# Patient Record
Sex: Female | Born: 1960 | Race: White | Hispanic: No | Marital: Single | Smoking: Current some day smoker
Health system: Southern US, Community
[De-identification: ages and names within clinical notes are randomized; demographics above are authoritative.]

## PROBLEM LIST (undated history)

## (undated) DIAGNOSIS — M722 Plantar fascial fibromatosis: Secondary | ICD-10-CM

## (undated) HISTORY — PX: OTHER SURGICAL HISTORY: SHX169

## (undated) HISTORY — PX: ABDOMINAL HYSTERECTOMY: SHX81

---

## 2014-11-10 ENCOUNTER — Encounter (HOSPITAL_BASED_OUTPATIENT_CLINIC_OR_DEPARTMENT_OTHER): Payer: Self-pay | Admitting: *Deleted

## 2014-11-10 ENCOUNTER — Emergency Department (HOSPITAL_BASED_OUTPATIENT_CLINIC_OR_DEPARTMENT_OTHER)
Admission: EM | Admit: 2014-11-10 | Discharge: 2014-11-10 | Disposition: A | Discharge status: Home / Self Care | Payer: Worker's Compensation | Attending: Emergency Medicine | Admitting: Emergency Medicine

## 2014-11-10 ENCOUNTER — Emergency Department (HOSPITAL_BASED_OUTPATIENT_CLINIC_OR_DEPARTMENT_OTHER): Payer: Worker's Compensation

## 2014-11-10 DIAGNOSIS — W01198A Fall on same level from slipping, tripping and stumbling with subsequent striking against other object, initial encounter: Secondary | ICD-10-CM | POA: Insufficient documentation

## 2014-11-10 DIAGNOSIS — W19XXXA Unspecified fall, initial encounter: Secondary | ICD-10-CM

## 2014-11-10 DIAGNOSIS — Y9389 Activity, other specified: Secondary | ICD-10-CM | POA: Insufficient documentation

## 2014-11-10 DIAGNOSIS — S59901A Unspecified injury of right elbow, initial encounter: Secondary | ICD-10-CM | POA: Diagnosis present

## 2014-11-10 DIAGNOSIS — Z720 Tobacco use: Secondary | ICD-10-CM | POA: Insufficient documentation

## 2014-11-10 DIAGNOSIS — S300XXA Contusion of lower back and pelvis, initial encounter: Secondary | ICD-10-CM | POA: Diagnosis not present

## 2014-11-10 DIAGNOSIS — Y9289 Other specified places as the place of occurrence of the external cause: Secondary | ICD-10-CM | POA: Diagnosis not present

## 2014-11-10 DIAGNOSIS — S40811A Abrasion of right upper arm, initial encounter: Secondary | ICD-10-CM | POA: Insufficient documentation

## 2014-11-10 DIAGNOSIS — Y998 Other external cause status: Secondary | ICD-10-CM | POA: Insufficient documentation

## 2014-11-10 DIAGNOSIS — S5001XA Contusion of right elbow, initial encounter: Secondary | ICD-10-CM | POA: Diagnosis not present

## 2014-11-10 HISTORY — DX: Plantar fascial fibromatosis: M72.2

## 2014-11-10 MED ORDER — HYDROCODONE-ACETAMINOPHEN 5-325 MG PO TABS: 1.0000 | ORAL_TABLET | Freq: Four times a day (QID) | ORAL | Status: AC | PRN

## 2014-11-10 MED ORDER — IBUPROFEN 800 MG PO TABS
800.0000 mg | ORAL_TABLET | Freq: Once | ORAL | Status: AC
  Administered 2014-11-10: 800 mg via ORAL
  Filled 2014-11-10: qty 1

## 2014-11-10 NOTE — Discharge Instructions (Signed)
You can take ibuprofen or aleve, available over the counter, for pain during the day and while working.  Only take the prescribed pain medication (norco) when you are not working or at night for sleep.    Tailbone Injury The tailbone (coccyx) is the small bone at the lower end of the spine. A tailbone injury may involve stretched ligaments, bruising, or a broken bone (fracture). Women are more vulnerable to this injury due to having a wider pelvis. CAUSES  This type of injury typically occurs from falling and landing on the tailbone. Repeated strain or friction from actions such as rowing and bicycling may also injure the area. The tailbone can be injured during childbirth. Infections or tumors may also press on the tailbone and cause pain. Sometimes, the cause of injury is unknown. SYMPTOMS   Bruising.  Pain when sitting.  Painful bowel movements.  In women, pain during intercourse. DIAGNOSIS  Your caregiver can diagnose a tailbone injury based on your symptoms and a physical exam. X-rays may be taken if a fracture is suspected. Your caregiver may also use an MRI scan imaging test to evaluate your symptoms. TREATMENT  Your caregiver may prescribe medicines to help relieve your pain. Most tailbone injuries heal on their own in 4 to 6 weeks. However, if the injury is caused by an infection or tumor, the recovery period may vary. PREVENTION  Wear appropriate padding and sports gear when bicycling and rowing. This can help prevent an injury from repeated strain or friction. HOME CARE INSTRUCTIONS   Put ice on the injured area.  Put ice in a plastic bag.  Place a towel between your skin and the bag.  Leave the ice on for 15-20 minutes, every hour while awake for the first 1 to 2 days.  Sit on a large, rubber or inflated ring or cushion to ease your pain. Lean forward when sitting to help decrease discomfort.  Avoid sitting for long periods of time.  Increase your activity as the pain  allows.  Only take over-the-counter or prescription medicines for pain, discomfort, or fever as directed by your caregiver.  You may use stool softeners if it is painful to have a bowel movement, or as directed by your caregiver.  Eat a diet with plenty of fiber to help prevent constipation.  Keep all follow-up appointments as directed by your caregiver. SEEK MEDICAL CARE IF:   Your pain becomes worse.  Your bowel movements cause a great deal of discomfort.  You are unable to have a bowel movement.  You have a fever. MAKE SURE YOU:  Understand these instructions.  Will watch your condition.  Will get help right away if you are not doing well or get worse. Document Released: 02/29/2000 Document Revised: 05/26/2011 Document Reviewed: 09/26/2010 [facility] Patient Information 2015 [place], [place]. This information is not intended to replace advice given to you by your health care provider. Make sure you discuss any questions you have with your health care provider.

## 2014-11-10 NOTE — ED Notes (Signed)
Fell at work yesterday. Pain in her coccyx and right elbow.

## 2014-11-10 NOTE — ED Provider Notes (Signed)
CSN: [id number]     Arrival date & time 11/10/14  1726 History  This chart was scribed for Tilden Fossa, MD by Evon Slack, ED Scribe. This patient was seen in room MH04/MH04 and the patient's care was started at 6:18 PM.      Chief Complaint  Patient presents with  . Fall   The history is provided by the patient. No language interpreter was used.   HPI Comments: Virginia James is a 54 y.o. female who presents to the Emergency Department complaining of fall onset 1 day prior. Pt states she fell off of a shelf and landed on her bottom. Pt states that she fell from about 6-7 feet. Pt states that she did land on concrete tile. Pt is complaining of low back pain, tailbone pain, and right elbow pain. Pt states she has taken hydrocodone with slight relief. Pt states she has applied ice with slight relief as well. Pt states that the pain is worse when ambulating. Pt denies head injury or LOC. Reports Hx superficial blood clot. She states that she stopped taking the Eliquis 3 days ago.    Past Medical History  Diagnosis Date  . Plantar fasciitis    Past Surgical History  Procedure Laterality Date  . Abdominal hysterectomy    . Tubal ligaton     No family history on file. Social History  Substance Use Topics  . Smoking status: Current Some Day Smoker    Types: Cigarettes  . Smokeless tobacco: None  . Alcohol Use: No   OB History    No data available      Review of Systems  Musculoskeletal: Positive for back pain and arthralgias.  Skin: Negative for wound.  Neurological: Negative for syncope and headaches.  All other systems reviewed and are negative.    Allergies  Review of patient's allergies indicates no known allergies.  Home Medications   Prior to Admission medications   Not on File   BP 123/63 mmHg  Pulse 101  Temp(Src) 98.6 F (37 C) (Oral)  Resp 20  Ht  (1.6 m)  Wt 175 lb (79.379 kg)  BMI 31.01 kg/m2  SpO2 96%   Physical Exam  Constitutional:  She is oriented to person, place, and time. She appears well-developed and well-nourished. No distress.  HENT:  Head: Normocephalic and atraumatic.  Eyes: Conjunctivae and EOM are normal.  Neck: Neck supple. No tracheal deviation present.  Cardiovascular: Normal rate, regular rhythm and normal heart sounds.   Pulmonary/Chest: Effort normal and breath sounds normal. No respiratory distress. She has no wheezes. She has no rales.  Musculoskeletal: Normal range of motion.  small abrasion of right distal upper arm, full ROM of right elbow, 2+ radial pulses.  Tender over midline of lower lumber spine and coccyx and small amount of ecchymosis over sacrum.   Neurological: She is alert and oriented to person, place, and time.  Strength 5/5 in all 4 extremities.   Skin: Skin is warm and dry.  Psychiatric: She has a normal mood and affect. Her behavior is normal.  Nursing note and vitals reviewed.   ED Course  Procedures (including critical care time) DIAGNOSTIC STUDIES: Oxygen Saturation is 96% on RA, adequate by my interpretation.    COORDINATION OF CARE: 6:30 PM-Discussed treatment plan with pt at bedside and pt agreed to plan.    Labs Review Labs Reviewed - No data to display  Imaging Review Dg Lumbar Spine Complete  11/10/2014   CLINICAL DATA:  Status  post fall.  EXAM: LUMBAR SPINE - COMPLETE 4+ VIEW  COMPARISON:  None  FINDINGS: There is no evidence of lumbar spine fracture. Alignment is normal. Intervertebral disc spaces are maintained.  IMPRESSION: Negative.   Electronically Signed   By: Signa Kell M.D.   On: 11/10/2014 19:25   Dg Sacrum/coccyx  11/10/2014   CLINICAL DATA:  Larey Seat off a shelf and landed on her bottom 1 day ago, approximately 6-7 foot fall, low back and tailbone pain  EXAM: SACRUM AND COCCYX - 2+ VIEW  COMPARISON:  None  FINDINGS: Symmetric SI joints and sacral foramina.  Osseous mineralization normal.  Hip joints symmetric.  No definite fracture or bone destruction.   IMPRESSION: No acute osseous abnormalities.   Electronically Signed   By: Ulyses Southward M.D.   On: 11/10/2014 19:26   Dg Elbow Complete Right  11/10/2014   CLINICAL DATA:  Larey Seat off shelf and landed on buttocks 1 day ago. Fall from 6-7 feet. Low back pain and coccygeal pain. Right elbow pain.  EXAM: RIGHT ELBOW - COMPLETE 3+ VIEW  COMPARISON:  None.  FINDINGS: There is no evidence of fracture, dislocation, or joint effusion. There is no evidence of arthropathy or other focal bone abnormality. Soft tissues are unremarkable.  IMPRESSION: Negative.   Electronically Signed   By: Elberta Fortis M.D.   On: 11/10/2014 19:26   I have personally reviewed and evaluated these images and lab results as part of my medical decision-making.   EKG Interpretation None      MDM   Final diagnoses:  Fall, initial encounter  Elbow contusion, right, initial encounter  Coccyx contusion, initial encounter   Patient here for evaluation of tailbone and low back pain following a fall yesterday. Patient is neurologically intact examination. There is no evidence of acute fracture on plain film imaging. Discussed with patient on care for coccyx contusion with stool softeners activity as tolerated, NSAIDs, outpatient follow-up.   I personally performed the services described in this documentation, which was scribed in my presence. The recorded information has been reviewed and is accurate.      Tilden Fossa, MD 11/10/14 [phone]

## 2017-02-03 ENCOUNTER — Telehealth: Payer: Self-pay

## 2017-02-03 NOTE — Telephone Encounter (Signed)
error 

## 2017-03-28 IMAGING — DX DG SACRUM/COCCYX 2+V
3 series · 3 of 3 positions shown · non-contrast
Comparison: None

CLINICAL DATA: Fell off a shelf and landed on her bottom 1 day ago,
approximately 6-7 foot fall, low back and tailbone pain

EXAM:
SACRUM AND COCCYX - 2+ VIEW

[coccyx ap]
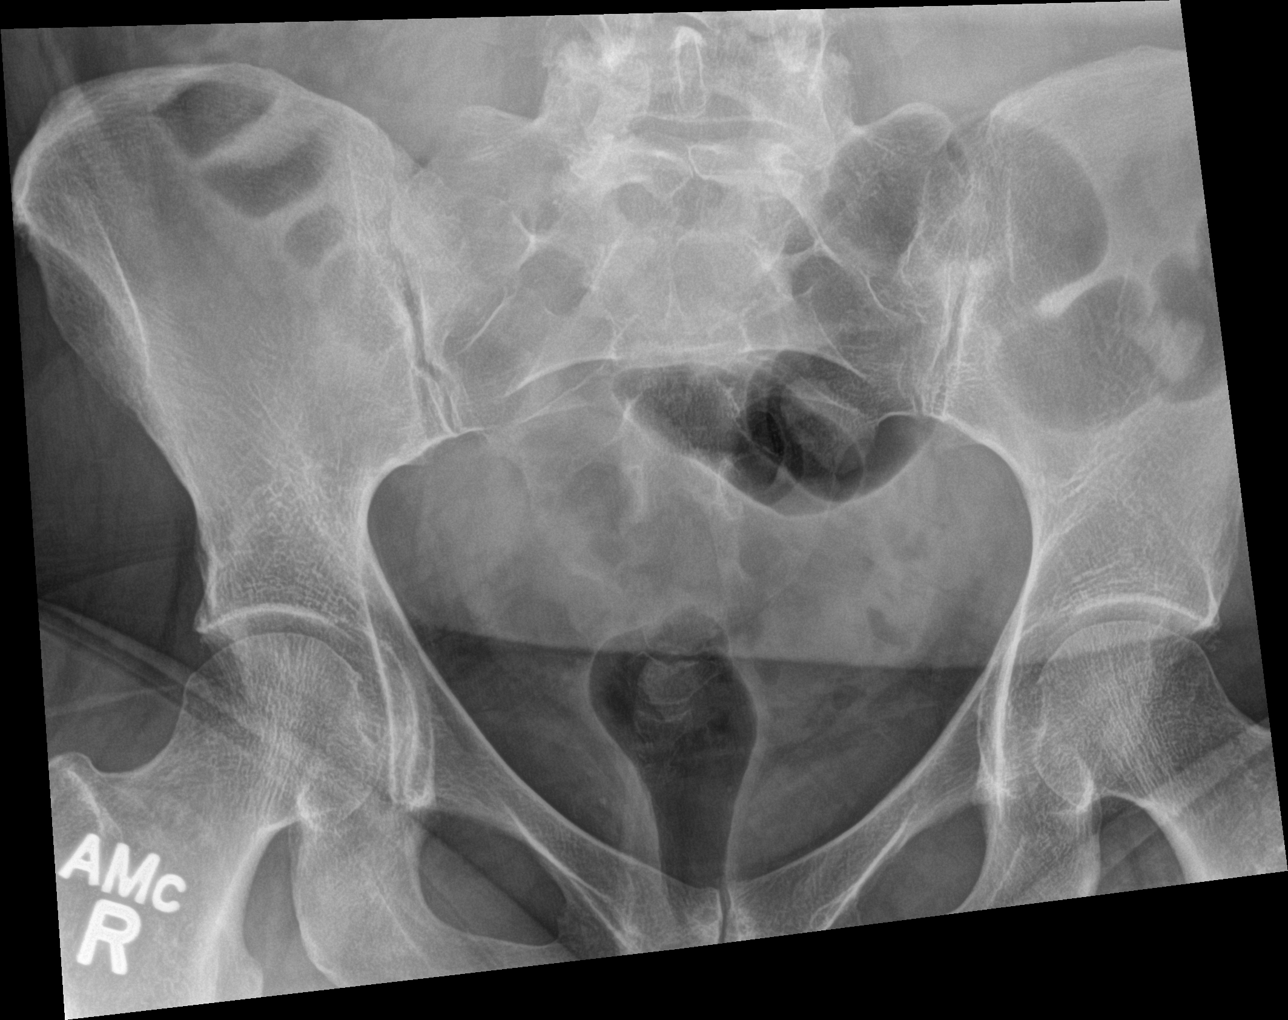

[sacrum ap]
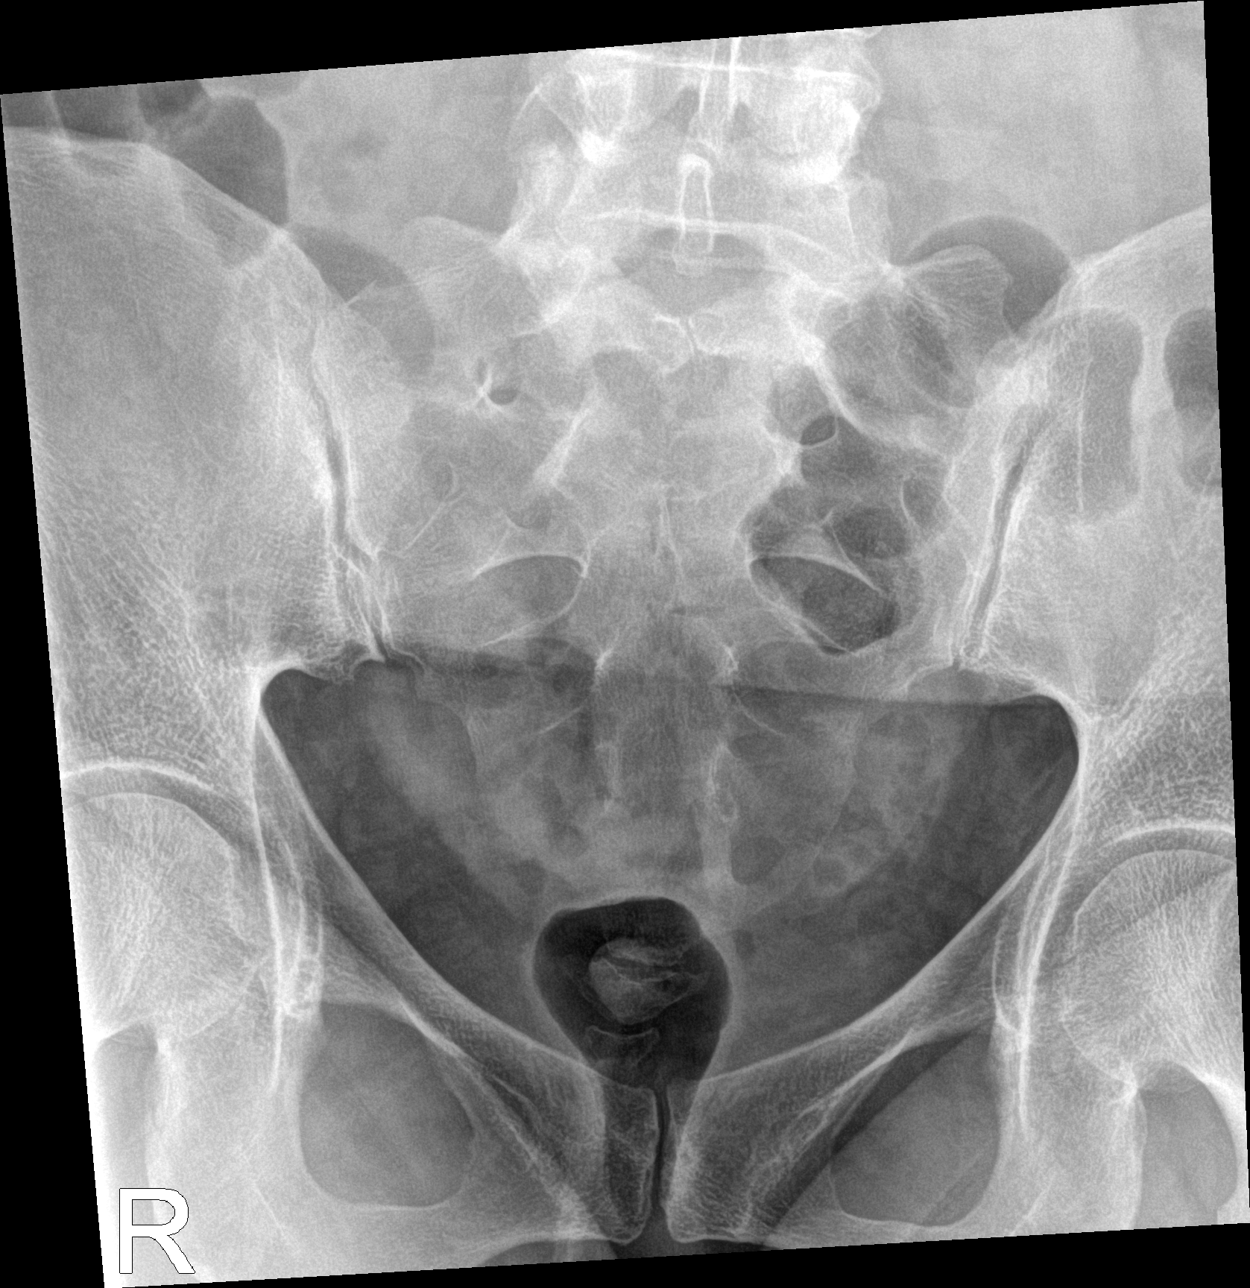

[sacrum lat]
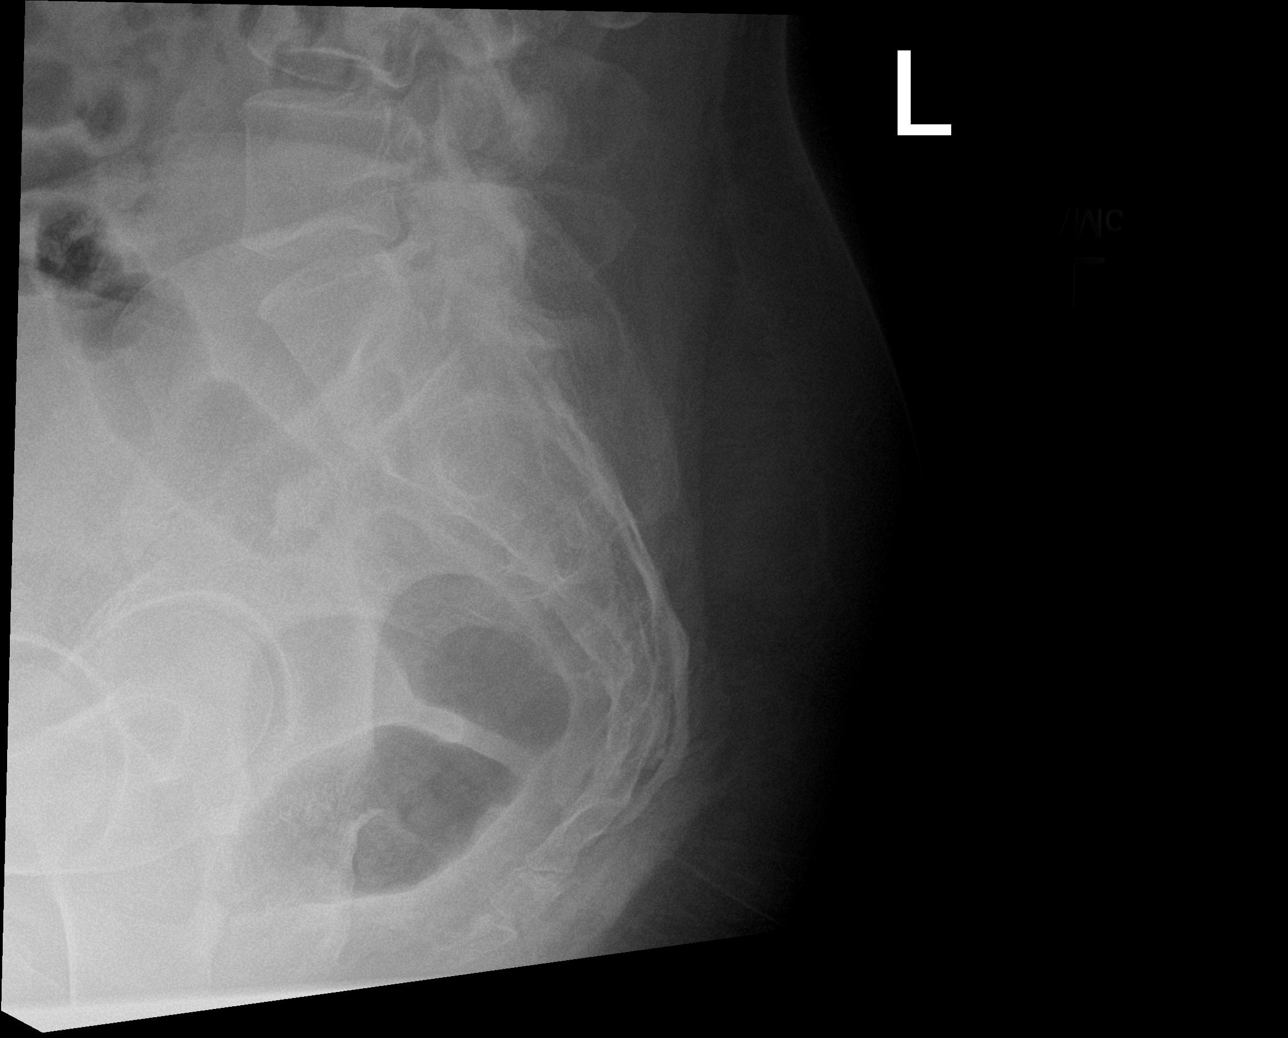

[3 of 3 positions shown; findings below may reference images not displayed]

FINDINGS: Symmetric SI joints and sacral foramina.

Osseous mineralization normal.

Hip joints symmetric.

No definite fracture or bone destruction.
IMPRESSION: No acute osseous abnormalities.

## 2023-12-25 ENCOUNTER — Ambulatory Visit: Payer: Self-pay | Admitting: Emergency Medicine

## 2024-01-01 ENCOUNTER — Ambulatory Visit: Payer: Self-pay | Admitting: Emergency Medicine

## 2024-03-02 ENCOUNTER — Ambulatory Visit: Admitting: Emergency Medicine

## 2024-03-16 ENCOUNTER — Ambulatory Visit: Admitting: Emergency Medicine

## 2024-03-25 ENCOUNTER — Ambulatory Visit: Admitting: Emergency Medicine

## 2024-03-25 ENCOUNTER — Encounter: Payer: Self-pay | Admitting: Emergency Medicine

## 2024-03-25 VITALS — BP 112/72 | HR 85 | Ht 63.0 in | Wt 168.4 lb

## 2024-03-25 DIAGNOSIS — F17210 Nicotine dependence, cigarettes, uncomplicated: Secondary | ICD-10-CM

## 2024-03-25 DIAGNOSIS — J449 Chronic obstructive pulmonary disease, unspecified: Secondary | ICD-10-CM | POA: Diagnosis not present

## 2024-03-25 DIAGNOSIS — R918 Other nonspecific abnormal finding of lung field: Secondary | ICD-10-CM | POA: Diagnosis not present

## 2024-03-25 NOTE — Patient Instructions (Addendum)
" °  VISIT SUMMARY: During your visit, we discussed the tracheal wall nodule found on your recent lung cancer screening CT scan, your mild chronic obstructive pulmonary disease (COPD), and your efforts to quit smoking. We reviewed your medical history, including your significant history of tobacco use and previous respiratory issues.  YOUR PLAN: -TRACHEAL WALL NODULE: A tracheal wall nodule is a small growth on the wall of the trachea. We need to further evaluate the 4.6 mm nodule found on your CT scan to determine its nature. We have ordered a repeat CT scan to assess the nodule and check for mucus plugging. Depending on the results, we may consider a bronchoscopy for further examination.  -CHRONIC OBSTRUCTIVE PULMONARY DISEASE (COPD): COPD is a chronic lung disease often caused by long-term smoking, leading to breathing difficulties. Your mild COPD is likely due to your history of smoking. We encourage you to continue your efforts to quit smoking, as this can improve your symptoms. We may consider pulmonary function testing to assess your lung function further.  -NICOTINE DEPENDENCE: Nicotine dependence is an addiction to tobacco products. You have a long history of smoking but are currently making efforts to quit using nicotine replacement therapy. Continue using nicotine gum and patches as part of your quitting strategy, and avoid using vaping as a substitute.  INSTRUCTIONS: Please schedule a repeat CT scan to evaluate the tracheal wall nodule and mucus plugging. Continue your smoking cessation efforts with nicotine replacement therapy. We may consider pulmonary function testing in the future to assess your lung function. Follow up with us  to review the results of your CT scan and discuss the next steps.         "

## 2024-03-25 NOTE — Progress Notes (Signed)
 "  Subjective:    Patient ID: Virginia James, female    DOB: 12-15-1960, 64 y.o.   MRN: [id number]  HPI Discussed the use of AI scribe software for clinical note transcription with the patient, who gave verbal consent to proceed.  History of Present Illness Virginia James is a 64 year old female who presents for evaluation of a tracheal wall nodule found on lung cancer screening CT scan.  A lung cancer screening CT scan on October 04, 2023, identified a 4.6 mm tracheal wall nodule and a left upper lobe distal interbronchial nodular mucous plugging. The scan also showed emphysematous changes but no effusions. No further imaging has been conducted since then.  She has a significant history of tobacco use, having smoked a pack and a half per day for approximately 40 years, starting at age 11. She is currently attempting to quit smoking, using nicotine gum and considering patches, and reports some days without smoking at all.  She has a history of pneumonia several years ago and has experienced episodes of bronchitis requiring treatment with prednisone, Tessalon Perles, and Levaquin. She has a prescription for albuterol but has not used it recently.  She has lost weight, going from 170 pounds to 136 pounds, and is active, doing a lot of walking at work. She is a runner, broadcasting/film/video, now works in [place] .  She has a history of allergies, experiencing postnasal drip, but otherwise reports being in good health. No other significant exposures apart from smoking.   CT scan of the chest report personally reviewed by me. Results Radiology Chest CT (10/04/2023): 4.6 mm tracheal wall nodule; left upper lobe distal interbronchial nodular mucous plugging; clear lungs except for emphysematous changes; no pleural effusion (Independently interpreted)    Review of Systems As per HPI  Past Medical History:  Diagnosis Date   Plantar fasciitis     History reviewed. No pertinent family history.   Social  History   Socioeconomic History   Marital status: Single    Spouse name: Not on file   Number of children: Not on file   Years of education: Not on file   Highest education level: Not on file  Occupational History   Not on file  Tobacco Use   Smoking status: Every Day    Types: Cigarettes   Smokeless tobacco: Not on file   Tobacco comments:    5 cigs per day 03/26/2023.     Pt uses [facility] to help quit smoking.   Substance and Sexual Activity   Alcohol use: No   Drug use: No   Sexual activity: Not on file  Other Topics Concern   Not on file  Social History Narrative   Not on file   Social Drivers of Health   Tobacco Use: High Risk (03/25/2024)   Patient History    Smoking Tobacco Use: Every Day    Smokeless Tobacco Use: Unknown    Passive Exposure: Not on file  Financial Resource Strain: Not on file  Food Insecurity: Low Risk (09/06/2023)   Received from [facility]   Epic    Within the past 12 months, you worried that your food would run out before you got money to buy more: Never true    Within the past 12 months, the food you bought just didn't last and you didn't have money to get more. : Never true  Transportation Needs: No Transportation Needs (09/06/2023)   Received from [facility]    In the  past 12 months, has lack of reliable transportation kept you from medical appointments, meetings, work or from getting things needed for daily living? : No  Physical Activity: Not on file  Stress: Not on file  Social Connections: Not on file  Intimate Partner Violence: Not on file  Depression (EYV7-0): Not on file  Alcohol Screen: Not on file  Housing: Medium Risk (09/06/2023)   Received from [facility]   Epic    What is your living situation today?: I have a steady place to live    Think about the place you live. Do you have problems with any of the following? Choose all that apply:: Mold  Utilities: Low Risk (09/06/2023)   Received from [facility]    Utilities    In the past 12 months has the electric, gas, oil, or water company threatened to shut off services in your home? : No  Health Literacy: Not on file    Allergies[1]  Medications Ordered Prior to Encounter[2]     Objective:    Vitals:   03/25/24 0933  BP: 112/72  Pulse: 85  SpO2: 97%  Weight: 168 lb 6.4 oz (76.4 kg)  Height: 5' 3 (1.6 m)   Physical Exam Gen: Pleasant, well-nourished, in no distress,  normal affect  ENT: No lesions,  mouth clear,  oropharynx clear, no postnasal drip  Neck: No JVD, no stridor  Lungs: No use of accessory muscles, no crackles or wheezing on normal respiration, no wheeze on forced expiration  Cardiovascular: RRR, heart sounds normal, no murmur or gallops, no peripheral edema  Musculoskeletal: No deformities, no cyanosis or clubbing  Neuro: alert, awake, non focal  Skin: Warm, no lesions or rashes       Assessment & Plan:   Assessment & Plan   Assessment & Plan Tracheal wall nodule 4.6 mm nodule on CT scan requires further evaluation. Differential includes mucus plugging or benign causes. No lung cancer evidence. - Ordered repeat CT scan to assess nodule and evaluate for mucus plugging. - Will review CT results to determine need for bronchoscopy.  Chronic obstructive pulmonary disease Mild COPD likely due to long-term smoking. Occasional cough improved with smoking cessation. No current inhaler use. - Encouraged continued smoking cessation. - Consider pulmonary function testing to assess lung function.  Nicotine dependence Long-term nicotine dependence with 1.5 packs/day for 40 years. Currently quitting with nicotine replacement therapy. Advised against vaping.  We discussed strategies for cutting down and she is working hard on this. - Continue nicotine replacement therapy with gum and patches. - Advised against vaping as a substitute.    No follow-ups on file.   I personally spent a total of 48 minutes in  the care of the patient today including preparing to see the patient, getting/reviewing separately obtained history, performing a medically appropriate exam/evaluation, counseling and educating, placing orders, documenting clinical information in the EHR, independently interpreting results, and communicating results.   Lamar Chris, MD, PhD 03/25/2024, 12:40 PM [facility] [phone] or if no answer before 7:00PM call [phone] For any issues after 7:00PM please call eLink [phone]     [1] No Known Allergies [2]  Current Outpatient Medications on File Prior to Visit  Medication Sig Dispense Refill   albuterol (VENTOLIN HFA) 108 (90 Base) MCG/ACT inhaler Inhale 2 puffs into the lungs every 6 (six) hours as needed for wheezing or shortness of breath.     clonazePAM (KLONOPIN) 0.5 MG tablet Take 0.5 mg by mouth 2 (two)  times daily as needed for anxiety.     ibuprofen  (ADVIL ) 600 MG tablet Take 600 mg by mouth 3 (three) times daily.     promethazine (PHENERGAN) 25 MG tablet Take 25 mg by mouth every 6 (six) hours as needed.     rosuvastatin (CRESTOR) 40 MG tablet Take 40 mg by mouth daily.     HYDROcodone -acetaminophen  (NORCO/VICODIN) 5-325 MG per tablet Take 1 tablet by mouth every 6 (six) hours as needed. 10 tablet 0   No current facility-administered medications on file prior to visit.   "

## 2024-03-29 ENCOUNTER — Encounter: Payer: Self-pay | Admitting: Emergency Medicine

## 2024-03-31 ENCOUNTER — Encounter: Payer: Self-pay | Admitting: Thoracic Surgery (Cardiothoracic Vascular Surgery)

## 2024-04-05 ENCOUNTER — Other Ambulatory Visit

## 2024-04-15 ENCOUNTER — Ambulatory Visit: Admitting: Emergency Medicine

## 2024-04-20 ENCOUNTER — Ambulatory Visit: Admitting: Emergency Medicine

## 2024-05-04 ENCOUNTER — Other Ambulatory Visit

## 2024-05-18 ENCOUNTER — Ambulatory Visit: Admitting: Emergency Medicine
